# Patient Record
Sex: Male | Born: 1985 | Race: White | Hispanic: No | Marital: Married | Smoking: Current every day smoker
Health system: Southern US, Community
[De-identification: ages and names within clinical notes are randomized; demographics above are authoritative.]

---
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
## 2004-06-07 ENCOUNTER — Emergency Department (HOSPITAL_COMMUNITY): Admission: EM | Admit: 2004-06-07 | Discharge: 2004-06-07 | Payer: Self-pay | Admitting: Family Medicine

## 2005-02-24 ENCOUNTER — Emergency Department (HOSPITAL_COMMUNITY): Admission: EM | Admit: 2005-02-24 | Discharge: 2005-02-24 | Payer: Self-pay | Admitting: Emergency Medicine

## 2005-06-04 ENCOUNTER — Emergency Department (HOSPITAL_COMMUNITY): Admission: EM | Admit: 2005-06-04 | Discharge: 2005-06-04 | Payer: Self-pay | Admitting: Family Medicine

## 2006-11-27 ENCOUNTER — Emergency Department (HOSPITAL_COMMUNITY): Admission: EM | Admit: 2006-11-27 | Discharge: 2006-11-28 | Payer: Self-pay | Admitting: Emergency Medicine

## 2008-05-18 ENCOUNTER — Emergency Department (HOSPITAL_COMMUNITY): Admission: EM | Admit: 2008-05-18 | Discharge: 2008-05-18 | Payer: Self-pay | Admitting: Emergency Medicine

## 2009-01-04 ENCOUNTER — Emergency Department (HOSPITAL_COMMUNITY): Admission: EM | Admit: 2009-01-04 | Discharge: 2009-01-04 | Payer: Self-pay | Admitting: Family Medicine

## 2009-06-27 ENCOUNTER — Telehealth: Payer: Self-pay | Admitting: *Deleted

## 2009-07-28 ENCOUNTER — Emergency Department (HOSPITAL_COMMUNITY): Admission: EM | Admit: 2009-07-28 | Discharge: 2009-07-28 | Payer: Self-pay | Admitting: Emergency Medicine

## 2009-08-07 ENCOUNTER — Emergency Department (HOSPITAL_COMMUNITY): Admission: EM | Admit: 2009-08-07 | Discharge: 2009-08-07 | Payer: Self-pay | Admitting: Emergency Medicine

## 2009-09-11 ENCOUNTER — Emergency Department (HOSPITAL_COMMUNITY): Admission: EM | Admit: 2009-09-11 | Discharge: 2009-09-11 | Payer: Self-pay | Admitting: Emergency Medicine

## 2009-09-11 ENCOUNTER — Ambulatory Visit: Payer: Self-pay | Admitting: Psychiatry

## 2009-09-11 ENCOUNTER — Inpatient Hospital Stay (HOSPITAL_COMMUNITY): Admission: AD | Admit: 2009-09-11 | Discharge: 2009-09-15 | Payer: Self-pay | Admitting: Psychiatry

## 2009-09-28 ENCOUNTER — Emergency Department (HOSPITAL_COMMUNITY): Admission: EM | Admit: 2009-09-28 | Discharge: 2009-09-29 | Payer: Self-pay | Admitting: Emergency Medicine

## 2010-04-11 NOTE — Progress Notes (Signed)
Summary: triage  Phone Note Call from Patient Call back at [phone]   Caller: [facility] Summary of Call: They have a rx that says it was written by Dr. Perley Jain and before the call the police they need to verify if he wrote one for this pt. Initial call taken by: Clydell Hakim,  June 27, 2009 1:42 PM  Follow-up for Phone Call        drug is oxycodone 30mg . it is on a rx of Dr. Perley Jain. she thinks someone has a copy & is ordering themselves drugs. she has already notified the police. I asked her to fax it here. told her he is not a pt here & the doctor is on vacation. notifiied the 2 preceptors and Dennison Nancy. I went to all of the residents in clinic & reminded them to keep their rx pads secure. placed copy of the rx in Dr. Thurnell Lose, [place] box Follow-up by: Golden Circle RN,  June 27, 2009 2:10 PM

## 2010-05-06 IMAGING — CT CT HEAD W/O CM
1 series · 16 of 30 positions shown, 20 images · non-contrast
Comparison: None

CLINICAL DATA: Seizure today, confusion, EtOH and cocaine abuse

CT HEAD WITHOUT CONTRAST
TECHNIQUE: Contiguous axial images were obtained from the base of
the skull through the vertex without contrast.

[Series 2: headseq 4.8 h45s · axial · 0.43mm/px · z∈[-125,+4]mm · 16 of 30 slices shown, 20 images]
[im 2/30  brain]
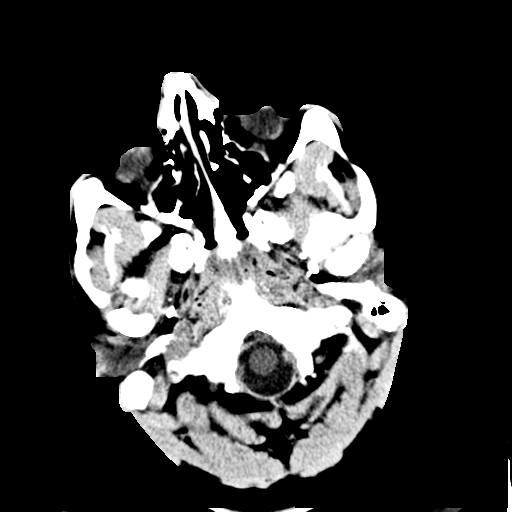
[im 2/30  bone]
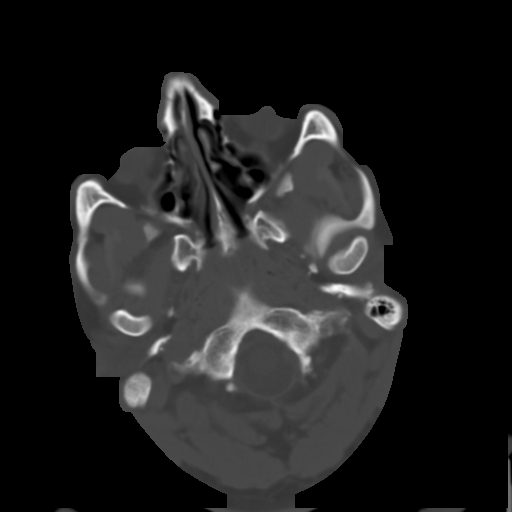
[im 4/30  brain]
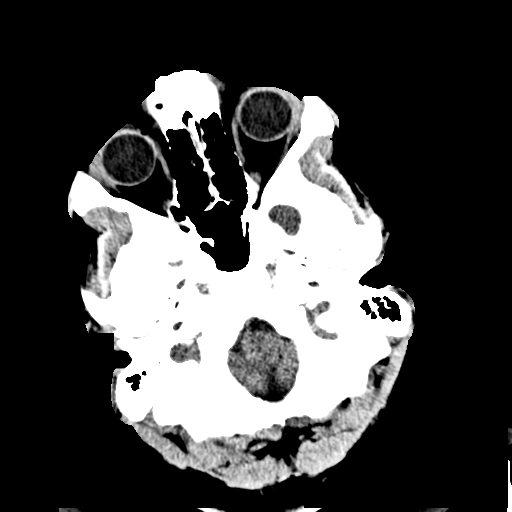
[im 6/30  brain]
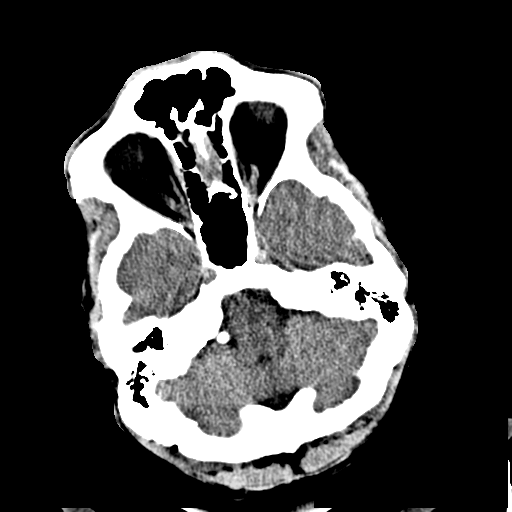
[im 8/30  brain]
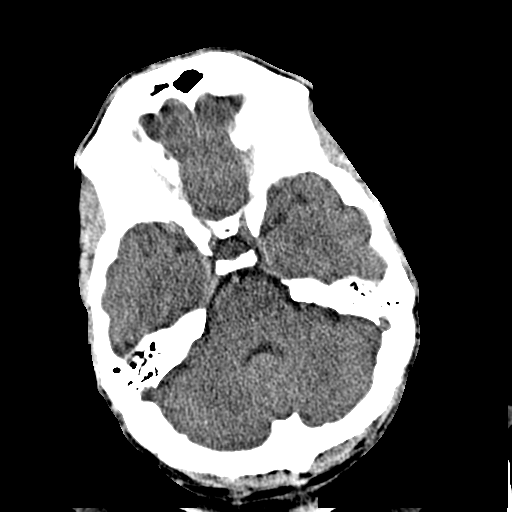
[im 9/30  brain]
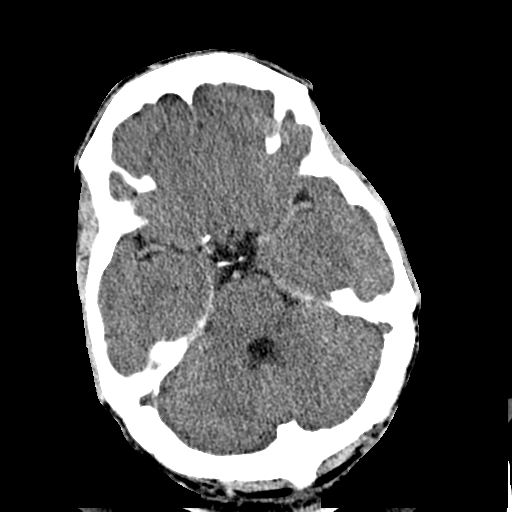
[im 9/30  bone]
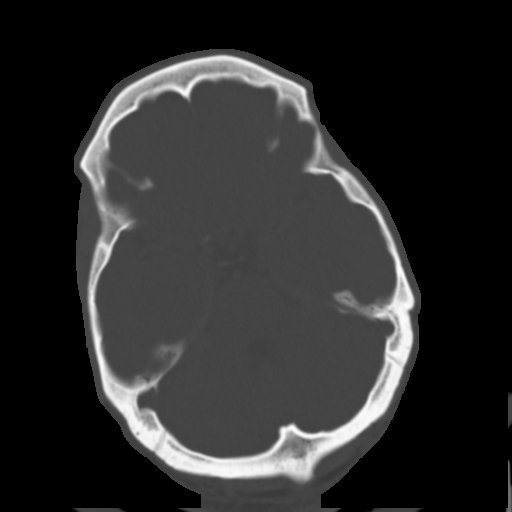
[im 11/30  brain]
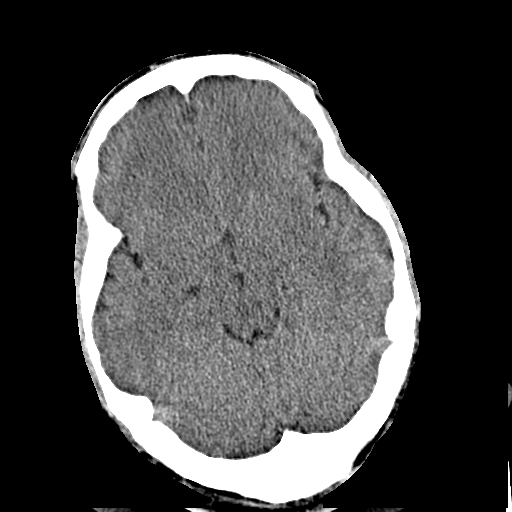
[im 13/30  brain]
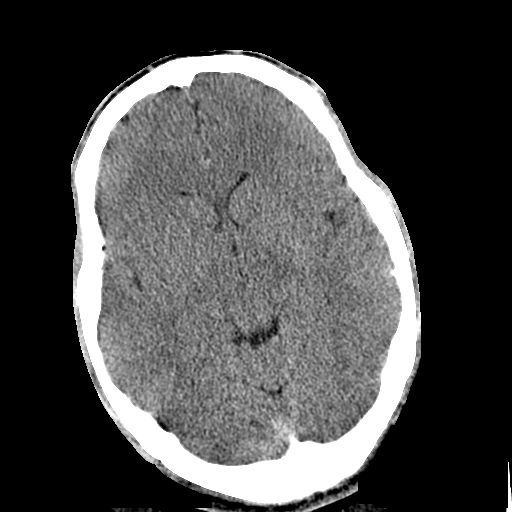
[im 15/30  brain]
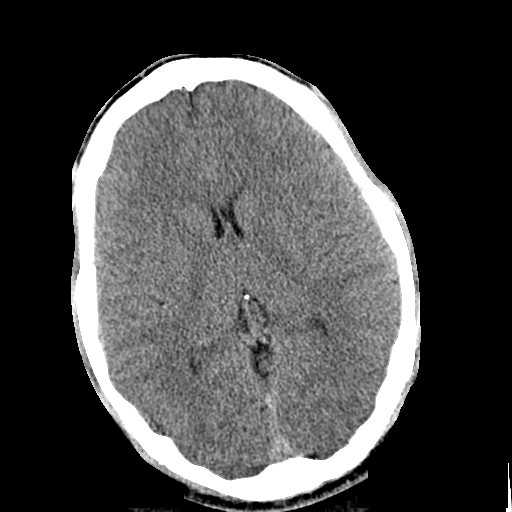
[im 16/30  brain]
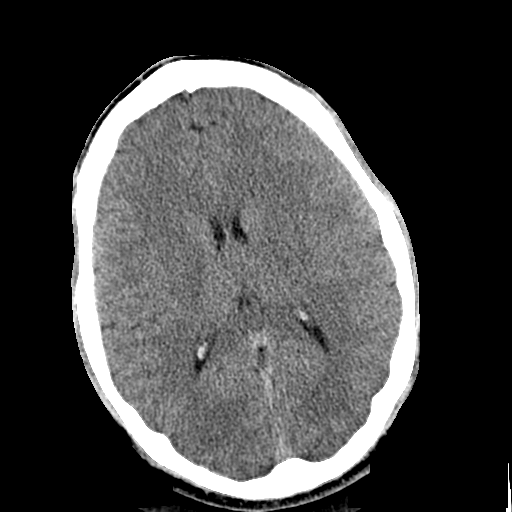
[im 16/30  bone]
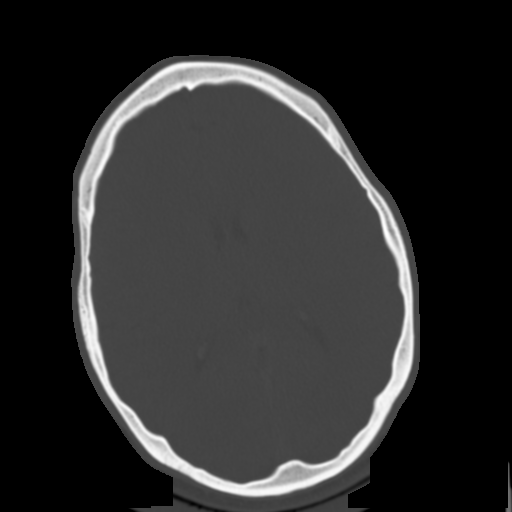
[im 18/30  brain]
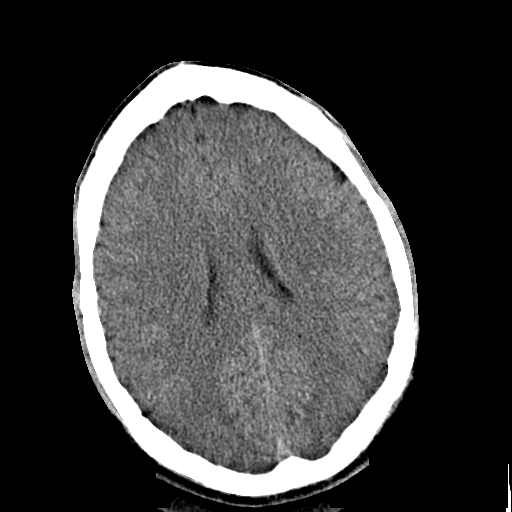
[im 20/30  brain]
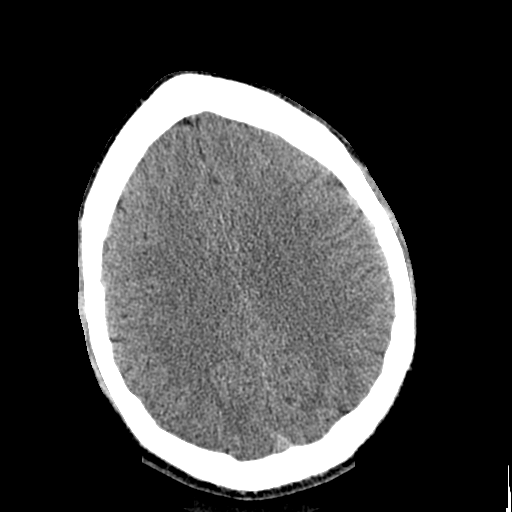
[im 22/30  brain]
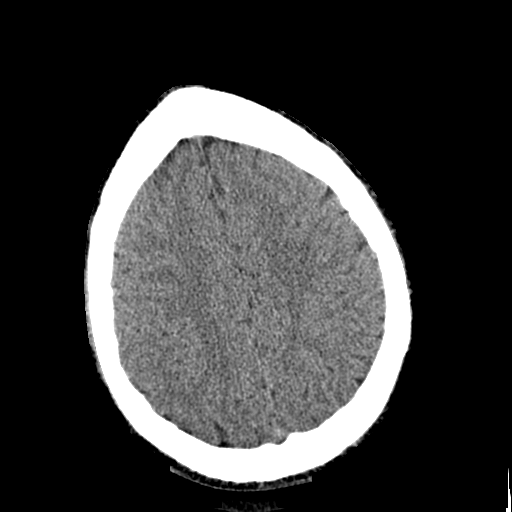
[im 23/30  brain]
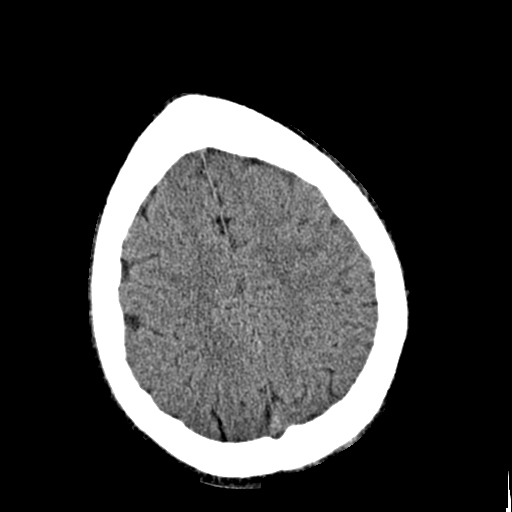
[im 23/30  bone]
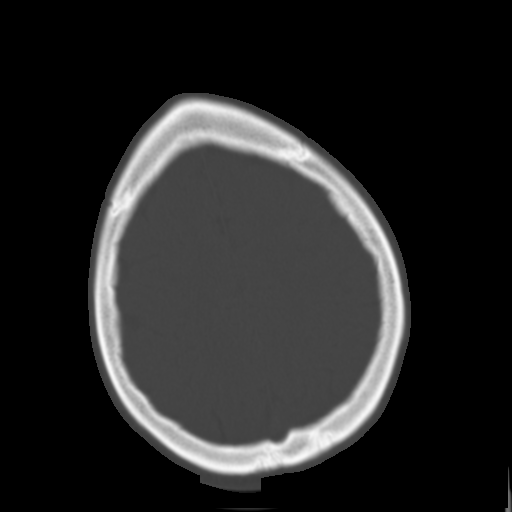
[im 25/30  brain]
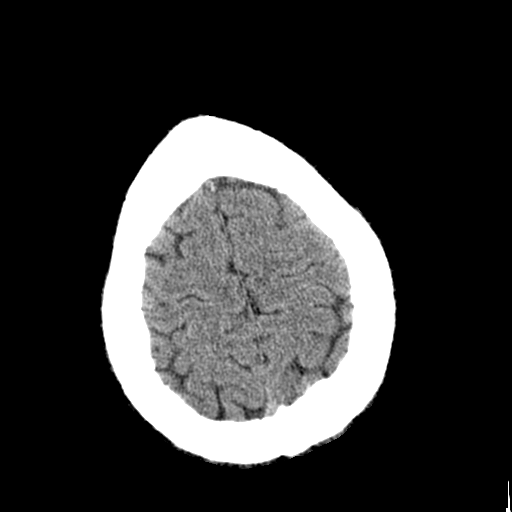
[im 27/30  brain]
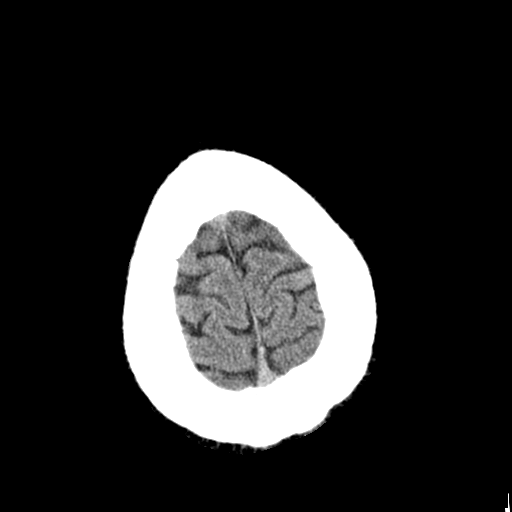
[im 29/30  brain]
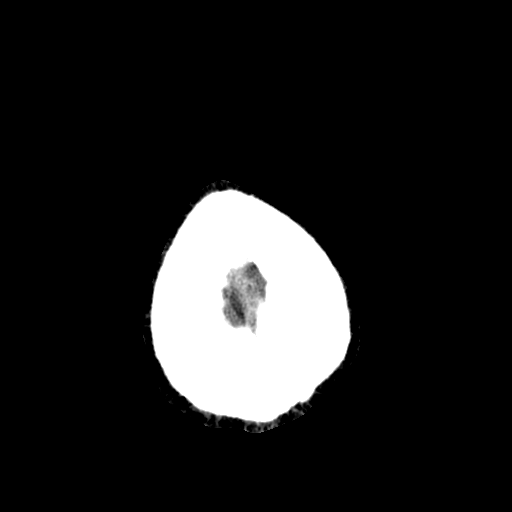

[16 of 30 positions shown; findings below may reference images not displayed]

FINDINGS: The ventricular system is normal in size and
configuration, and the septum is in a normal midline position.  The
fourth ventricle and basilar cisterns appear normal.  No blood,
edema, or mass effect is seen.  On bone window images no bony
abnormality is noted.
IMPRESSION: No acute intracranial abnormality.

## 2010-05-27 LAB — CBC
HCT: 45.2 % (ref 39.0–52.0)
Hemoglobin: 15.3 g/dL (ref 13.0–17.0)
MCH: 32.7 pg (ref 26.0–34.0)
MCHC: 33.9 g/dL (ref 30.0–36.0)
MCV: 96.3 fL (ref 78.0–100.0)
RDW: 13.9 % (ref 11.5–15.5)

## 2010-05-27 LAB — BASIC METABOLIC PANEL
BUN: 15 mg/dL (ref 6–23)
CO2: 26 mEq/L (ref 19–32)
Glucose, Bld: 90 mg/dL (ref 70–99)
Potassium: 3.8 mEq/L (ref 3.5–5.1)
Sodium: 134 mEq/L — ABNORMAL LOW (ref 135–145)

## 2010-05-27 LAB — DIFFERENTIAL
Basophils Relative: 0 % (ref 0–1)
Eosinophils Absolute: 0.1 10*3/uL (ref 0.0–0.7)
Eosinophils Relative: 2 % (ref 0–5)
Monocytes Absolute: 1.1 10*3/uL — ABNORMAL HIGH (ref 0.1–1.0)
Monocytes Relative: 13 % — ABNORMAL HIGH (ref 3–12)
Neutro Abs: 4.5 10*3/uL (ref 1.7–7.7)

## 2010-05-27 LAB — POCT CARDIAC MARKERS
CKMB, poc: 1 ng/mL (ref 1.0–8.0)
CKMB, poc: <1 ng/mL — ABNORMAL LOW (ref 1.0–8.0)
Troponin i, poc: <0.05 ng/mL (ref 0.00–0.09)

## 2010-05-27 LAB — URINALYSIS, ROUTINE W REFLEX MICROSCOPIC
Ketones, ur: NEGATIVE mg/dL
Nitrite: NEGATIVE
pH: 5.5 (ref 5.0–8.0)

## 2010-05-27 LAB — RAPID URINE DRUG SCREEN, HOSP PERFORMED
Cocaine: POSITIVE — AB
Tetrahydrocannabinol: NOT DETECTED

## 2010-05-28 LAB — BASIC METABOLIC PANEL
BUN: 14 mg/dL (ref 6–23)
Chloride: 101 mEq/L (ref 96–112)
Glucose, Bld: 96 mg/dL (ref 70–99)
Potassium: 3.4 mEq/L — ABNORMAL LOW (ref 3.5–5.1)

## 2010-05-28 LAB — CBC
HCT: 46.3 % (ref 39.0–52.0)
MCHC: 35.2 g/dL (ref 30.0–36.0)
MCV: 93.2 fL (ref 78.0–100.0)
RDW: 13.8 % (ref 11.5–15.5)

## 2010-05-28 LAB — RAPID URINE DRUG SCREEN, HOSP PERFORMED
Cocaine: POSITIVE — AB
Opiates: NOT DETECTED
Tetrahydrocannabinol: NOT DETECTED

## 2010-05-28 LAB — DIFFERENTIAL
Basophils Absolute: 0 10*3/uL (ref 0.0–0.1)
Basophils Relative: 1 % (ref 0–1)
Eosinophils Relative: 3 % (ref 0–5)
Monocytes Absolute: 0.9 10*3/uL (ref 0.1–1.0)

## 2010-06-22 LAB — URINALYSIS, ROUTINE W REFLEX MICROSCOPIC
Bilirubin Urine: NEGATIVE
Nitrite: NEGATIVE
Urobilinogen, UA: 0.2 mg/dL (ref 0.0–1.0)
pH: 6 (ref 5.0–8.0)

## 2010-06-22 LAB — DIFFERENTIAL
Basophils Absolute: 0 10*3/uL (ref 0.0–0.1)
Lymphocytes Relative: 25 % (ref 12–46)
Monocytes Absolute: 0.7 10*3/uL (ref 0.1–1.0)
Neutro Abs: 5.7 10*3/uL (ref 1.7–7.7)
Neutrophils Relative %: 66 % (ref 43–77)

## 2010-06-22 LAB — POCT I-STAT, CHEM 8
BUN: 8 mg/dL (ref 6–23)
Calcium, Ion: 1.13 mmol/L (ref 1.12–1.32)
Chloride: 107 mEq/L (ref 96–112)
Potassium: 3.8 mEq/L (ref 3.5–5.1)
Sodium: 140 mEq/L (ref 135–145)

## 2010-06-22 LAB — CBC
Hemoglobin: 14.4 g/dL (ref 13.0–17.0)
RDW: 15.1 % (ref 11.5–15.5)

## 2010-06-22 LAB — ETHANOL: Alcohol, Ethyl (B): 75 mg/dL — ABNORMAL HIGH (ref 0–10)

## 2010-06-22 LAB — RAPID URINE DRUG SCREEN, HOSP PERFORMED
Cocaine: POSITIVE — AB
Tetrahydrocannabinol: NOT DETECTED

## 2011-09-16 IMAGING — CR DG CHEST 2V
2 series · 2 of 2 positions shown · non-contrast
Comparison: Report from 12/05/2001

CLINICAL DATA: Chest pain.  Shortness of breath.  Cough.

CHEST - 2 VIEW

[w chest pa]
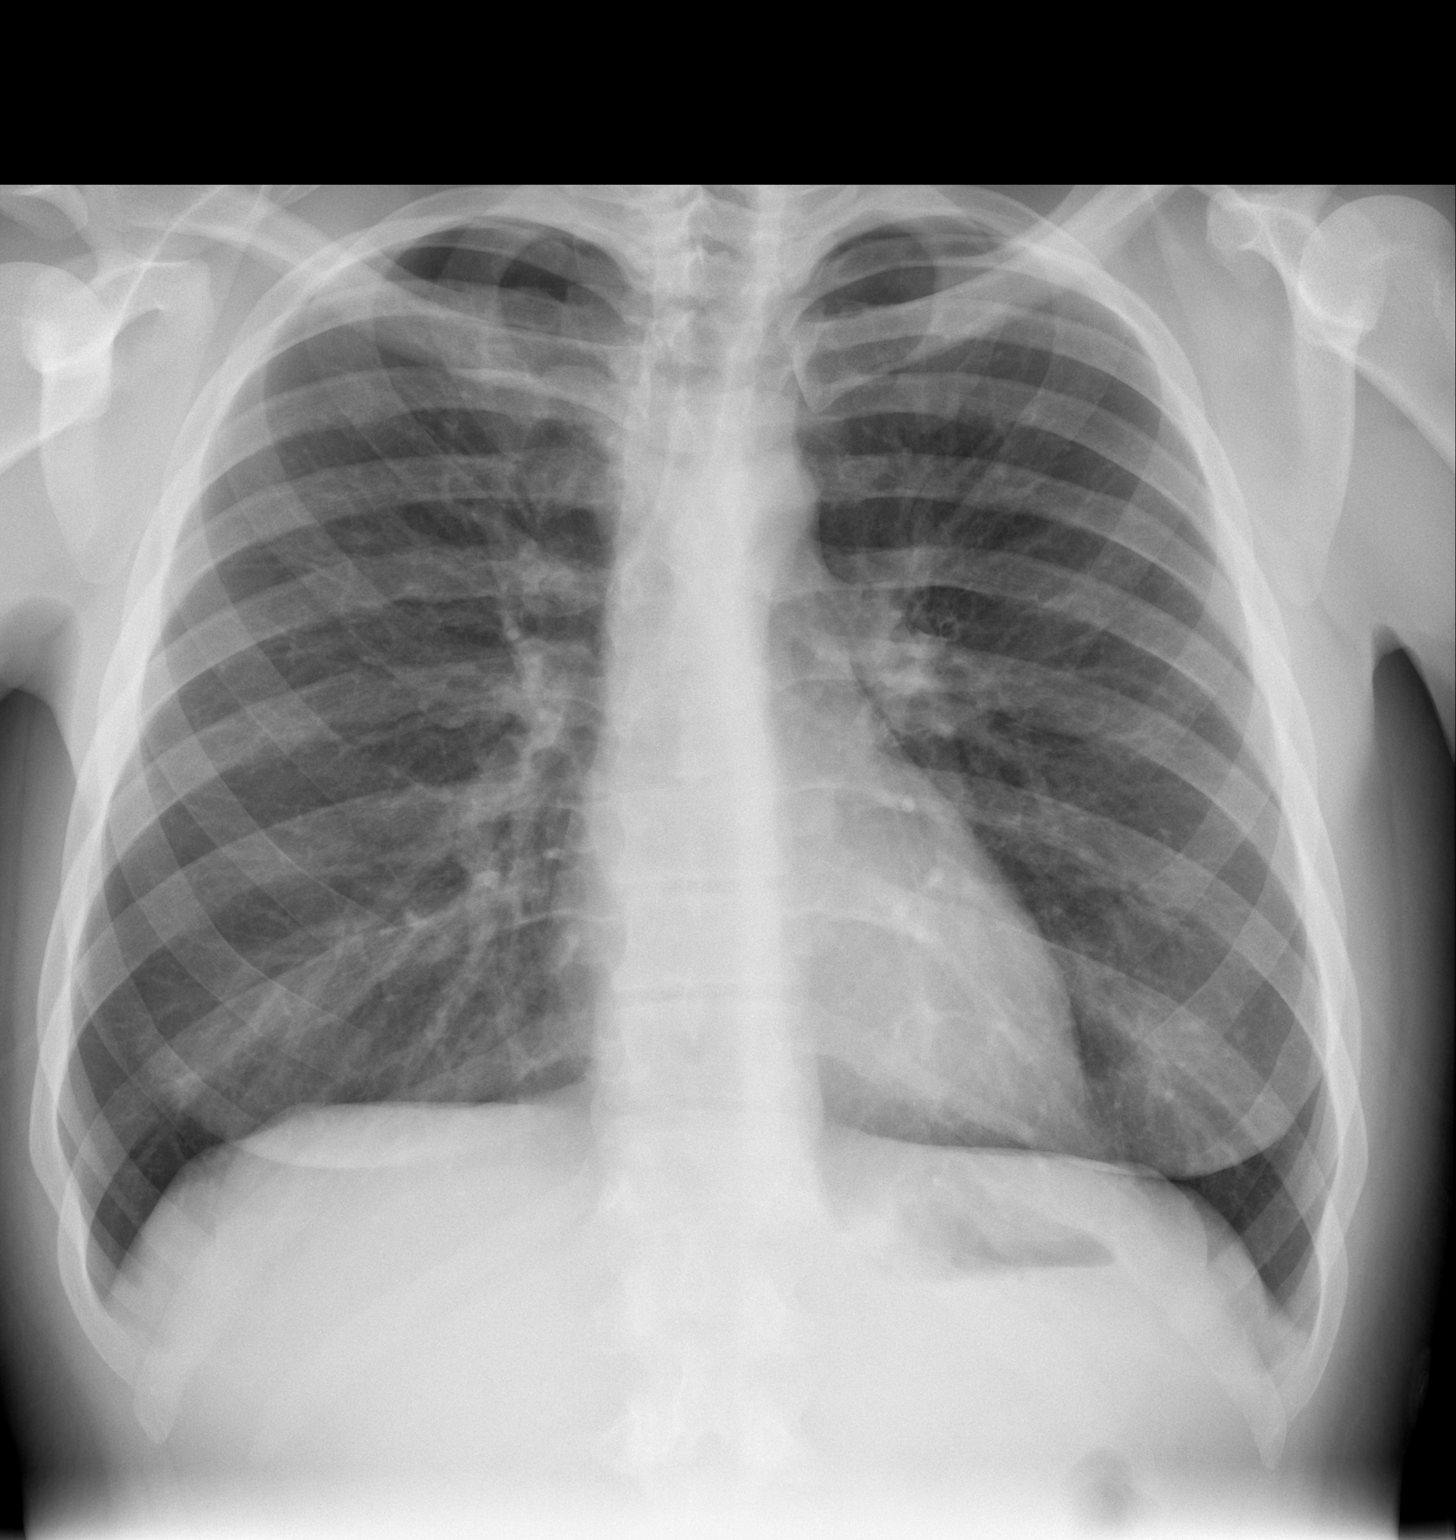

[w chest lat]
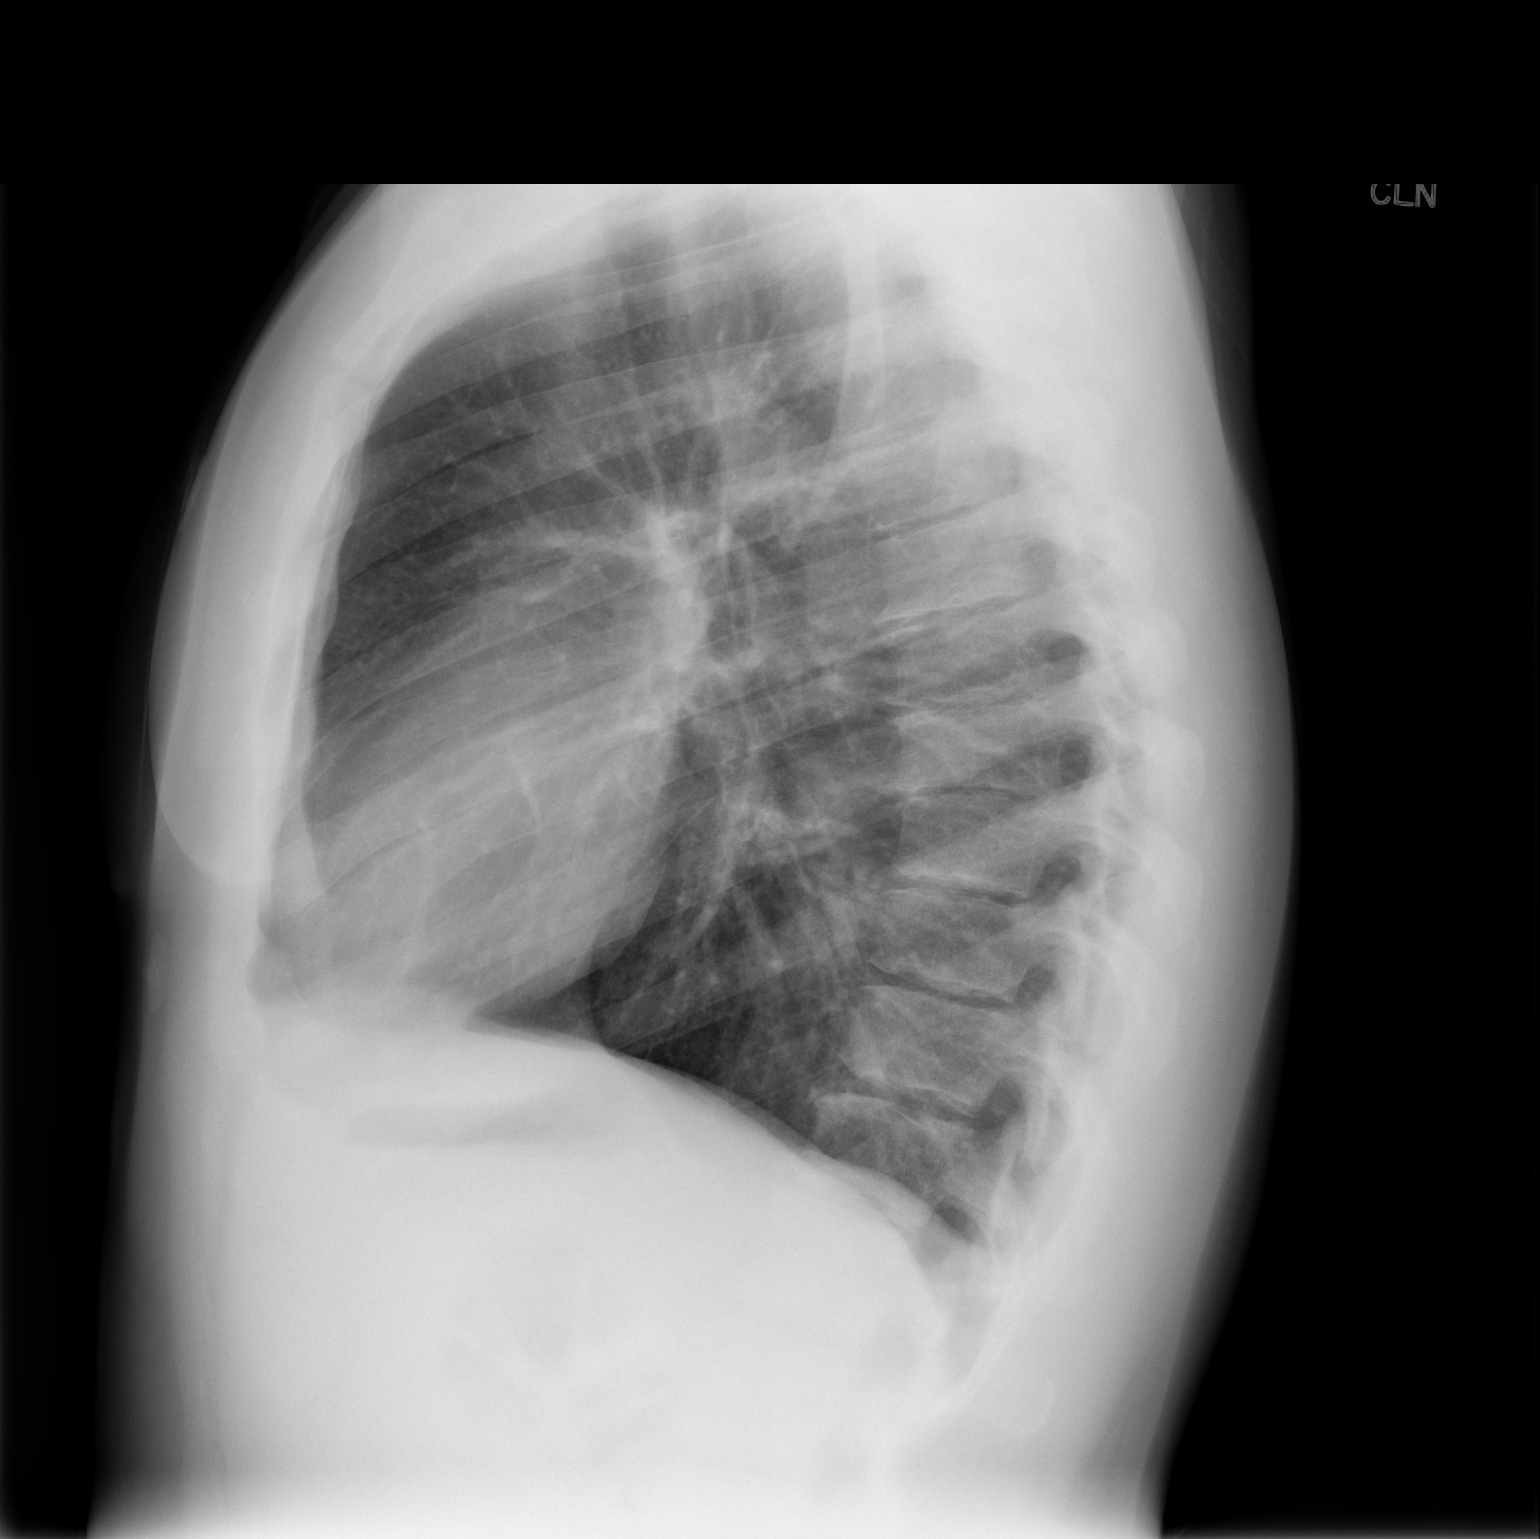

[2 of 2 positions shown; findings below may reference images not displayed]

FINDINGS: Cardiac and mediastinal contours appear normal.

The lungs appear clear.

No pleural effusion is identified.
IMPRESSION: No significant abnormality identified.

## 2013-06-24 ENCOUNTER — Emergency Department (HOSPITAL_COMMUNITY): Payer: Self-pay

## 2013-06-24 ENCOUNTER — Encounter (HOSPITAL_COMMUNITY): Payer: Self-pay | Admitting: Emergency Medicine

## 2013-06-24 ENCOUNTER — Emergency Department (HOSPITAL_COMMUNITY)
Admission: EM | Admit: 2013-06-24 | Discharge: 2013-06-24 | Disposition: A | Discharge status: Home / Self Care | Payer: Self-pay | Attending: Emergency Medicine | Admitting: Emergency Medicine

## 2013-06-24 DIAGNOSIS — X58XXXA Exposure to other specified factors, initial encounter: Secondary | ICD-10-CM | POA: Insufficient documentation

## 2013-06-24 DIAGNOSIS — Y929 Unspecified place or not applicable: Secondary | ICD-10-CM | POA: Insufficient documentation

## 2013-06-24 DIAGNOSIS — F17200 Nicotine dependence, unspecified, uncomplicated: Secondary | ICD-10-CM | POA: Insufficient documentation

## 2013-06-24 DIAGNOSIS — Y9302 Activity, running: Secondary | ICD-10-CM | POA: Insufficient documentation

## 2013-06-24 DIAGNOSIS — M25512 Pain in left shoulder: Secondary | ICD-10-CM

## 2013-06-24 DIAGNOSIS — M25519 Pain in unspecified shoulder: Secondary | ICD-10-CM

## 2013-06-24 DIAGNOSIS — S46909A Unspecified injury of unspecified muscle, fascia and tendon at shoulder and upper arm level, unspecified arm, initial encounter: Secondary | ICD-10-CM | POA: Insufficient documentation

## 2013-06-24 DIAGNOSIS — T148XXA Other injury of unspecified body region, initial encounter: Secondary | ICD-10-CM

## 2013-06-24 DIAGNOSIS — S4980XA Other specified injuries of shoulder and upper arm, unspecified arm, initial encounter: Secondary | ICD-10-CM | POA: Insufficient documentation

## 2013-06-24 DIAGNOSIS — S0081XA Abrasion of other part of head, initial encounter: Secondary | ICD-10-CM

## 2013-06-24 DIAGNOSIS — Z23 Encounter for immunization: Secondary | ICD-10-CM | POA: Insufficient documentation

## 2013-06-24 DIAGNOSIS — IMO0002 Reserved for concepts with insufficient information to code with codable children: Secondary | ICD-10-CM | POA: Insufficient documentation

## 2013-06-24 DIAGNOSIS — S40019A Contusion of unspecified shoulder, initial encounter: Secondary | ICD-10-CM | POA: Insufficient documentation

## 2013-06-24 MED ORDER — TETANUS-DIPHTH-ACELL PERTUSSIS 5-2.5-18.5 LF-MCG/0.5 IM SUSP
0.5000 mL | Freq: Once | INTRAMUSCULAR | Status: AC
  Administered 2013-06-24: 0.5 mL via INTRAMUSCULAR
  Filled 2013-06-24: qty 0.5

## 2013-06-24 NOTE — ED Provider Notes (Signed)
CSN: [id number]     Arrival date & time 06/24/13  1345 History   First MD Initiated Contact with Patient 06/24/13 1347     Chief Complaint  Patient presents with  . Shoulder Pain     (Consider location/radiation/quality/duration/timing/severity/associated sxs/prior Treatment) HPI  Patient presents with left shoulder pain after running from the police and being tackled.  States he initially injured it yesterday while also running from the police.  Pain is constant, worse with palpation, severe.  Denies weakness or numbness of the arms.  Denies chest pain or shortness of breath.  Denies other injury or pain elsewhere.    History reviewed. No pertinent past medical history. History reviewed. No pertinent past surgical history. No family history on file. History  Substance Use Topics  . Smoking status: Current Every Day Smoker  . Smokeless tobacco: Not on file  . Alcohol Use: Yes    Review of Systems  Respiratory: Negative for shortness of breath.   Cardiovascular: Negative for chest pain.  Gastrointestinal: Negative for abdominal pain.  Skin: Positive for wound.  Neurological: Negative for syncope, weakness and numbness.  All other systems reviewed and are negative.     Allergies  Review of patient's allergies indicates no known allergies.  Home Medications   Prior to Admission medications   Not on File   BP 127/77  Pulse 84  Temp(Src) 97.6 F (36.4 C) (Oral)  Resp 22  Ht 5\' 8"  (1.727 m)  Wt 200 lb (90.719 kg)  BMI 30.42 kg/m2  SpO2 95% Physical Exam  Nursing note and vitals reviewed. Constitutional: He appears well-developed and well-nourished. No distress.  HENT:  Head: Normocephalic.    Neck: Neck supple.  Cardiovascular: Normal rate and regular rhythm.   Pulmonary/Chest: Effort normal and breath sounds normal. No respiratory distress. He has no wheezes. He has no rales.  Abdominal: Soft. He exhibits no distension. There is no tenderness. There is no  rebound and no guarding.  Musculoskeletal:       Left shoulder: He exhibits pain. He exhibits no deformity, no laceration, normal pulse and normal strength.       Arms: Upper extremities:  Strength 5/5, sensation intact, distal pulses intact.    Neurological: He is alert. He exhibits normal muscle tone.  Skin: He is not diaphoretic.    ED Course  Procedures (including critical care time) Labs Review Labs Reviewed - No data to display  Imaging Review Dg Shoulder Left  06/24/2013   CLINICAL DATA:  Pain post trauma  EXAM: LEFT SHOULDER - 2+ VIEW  COMPARISON:  None.  FINDINGS: Internal rotation, external rotation, and Y scapular images were obtained. There is no fracture or dislocation. Joint spaces appear intact. No erosive change.  IMPRESSION: No abnormality noted.   Electronically Signed   By: Bretta BangWilliam  Woodruff M.D.   On: 06/24/2013 15:16     EKG Interpretation None      MDM   Final diagnoses:  Left shoulder pain  Contusion  AC joint pain  Facial abrasion    Pt with left shoulder pain after running from and being tackled by police.  Left shoulder with ecchymosis.  Tender throughout superior and lateral shoulder, including over AC joint.  Neurovascularly intact.   Xray negative.  Does have very small abrasion over right eyebrow.  No laceration.  Denies other injury.  D/C with police, with sling for comfort.  PCP follow up.  ICE, NSAIDs/tylenol for pain.  Discussed result, findings, treatment, and follow up  with patient.  Pt given return precautions.  Pt verbalizes understanding and agrees with plan.        Trixie Dredgemily Demarkus Remmel, PA-C 06/24/13 1534

## 2013-06-24 NOTE — ED Notes (Signed)
Patient transported to X-ray 

## 2013-06-24 NOTE — ED Notes (Signed)
Pt discharged in handcuffs with police. Sling sent with patient to be applied at jail.

## 2013-06-24 NOTE — ED Notes (Signed)
Per EMS- Pt in [employer] custody, handcuffed to bed. Reporting left shoulder pain, small laceration to right eye. Pt is a x 4. Is crying.

## 2013-06-24 NOTE — ED Notes (Signed)
Pt returned from xray. Hooked back up to monitor.  

## 2013-06-24 NOTE — Discharge Instructions (Signed)
Read the information below.  You may return to the Emergency Department at any time for worsening condition or any new symptoms that concern you.  You may take tylenol or ibuprofen as needed according to the direction on the packaging for pain.  You may apply ice to your shoulder several times a day for pain.  Please follow up with your primary care provider.  If you develop uncontrolled pain, weakness or numbness of the extremity, severe discoloration of the skin, or you are unable to move your arm, return to the ER for a recheck.   Keep the wound on your face clean, cover with bacitracin/neosporin and a bandage while it heals.  If you develop redness, swelling, pus draining from the wound, or fevers greater than 100.4, return to the ER immediately for a recheck.     Abrasions An abrasion is a cut or scrape of the skin. Abrasions do not go through all layers of the skin. HOME CARE  If a bandage (dressing) was put on your wound, change it as told by your doctor. If the bandage sticks, soak it off with warm.  Wash the area with water and soap 2 times a day. Rinse off the soap. Pat the area dry with a clean towel.  Put on medicated cream (ointment) as told by your doctor.  Change your bandage right away if it gets wet or dirty.  Only take medicine as told by your doctor.  See your doctor within 24 48 hours to get your wound checked.  Check your wound for redness, puffiness (swelling), or yellowish-white fluid (pus). GET HELP RIGHT AWAY IF:   You have more pain in the wound.  You have redness, swelling, or tenderness around the wound.  You have pus coming from the wound.  You have a fever or lasting symptoms for more than 2 3 days.  You have a fever and your symptoms suddenly get worse.  You have a bad smell coming from the wound or bandage. MAKE SURE YOU:   Understand these instructions.  Will watch your condition.  Will get help right away if you are not doing well or get  worse. Document Released: 08/15/2007 Document Revised: 11/21/2011 Document Reviewed: 01/30/2011 [facility] Patient Information 2014 [place], [place].  Contusion A contusion is a deep bruise. Contusions happen when an injury causes bleeding under the skin. Signs of bruising include pain, puffiness (swelling), and discolored skin. The contusion may turn blue, purple, or yellow. HOME CARE   Put ice on the injured area.  Put ice in a plastic bag.  Place a towel between your skin and the bag.  Leave the ice on for 15-20 minutes, 03-04 times a day.  Only take medicine as told by your doctor.  Rest the injured area.  If possible, raise (elevate) the injured area to lessen puffiness. GET HELP RIGHT AWAY IF:   You have more bruising or puffiness.  You have pain that is getting worse.  Your puffiness or pain is not helped by medicine. MAKE SURE YOU:   Understand these instructions.  Will watch your condition.  Will get help right away if you are not doing well or get worse. Document Released: 08/15/2007 Document Revised: 05/21/2011 Document Reviewed: 01/01/2011 [facility] Patient Information 2014 [place], [place].  Shoulder Pain The shoulder is the joint that connects your arm to your body. Muscles and band-like tissues that connect bones to muscles (tendons) hold the joint together. Shoulder pain is felt if an injury or medical problem  affects one or more parts of the shoulder. HOME CARE   Put ice on the sore area.  Put ice in a plastic bag.  Place a towel between your skin and the bag.  Leave the ice on for 15-20 minutes, 03-04 times a day for the first 2 days.  Stop using cold packs if they do not help with the pain.  If you were given something to keep your shoulder from moving (sling, shoulder immobilizer), wear it as told. Only take it off to shower or bathe.  Move your arm as little as possible, but keep your hand moving to prevent puffiness (swelling).  Squeeze a soft  ball or foam pad as much as possible to help prevent swelling.  Take medicine as told by your doctor. GET HELP RIGHT AWAY IF:   Your arm, hand, or fingers are numb or tingling.  Your arm, hand, or fingers are puffy (swollen), painful, or turn white or blue.  You have more pain.  You have progressing new pain in your arm, hand, or fingers.  Your hand or fingers get cold.  Your medicine does not help lessen your pain. MAKE SURE YOU:   Understand these instructions.  Will watch your condition.  Will get help right away if you are not doing well or get worse. Document Released: 08/15/2007 Document Revised: 11/21/2011 Document Reviewed: 09/10/2011 [facility] Patient Information 2014 [place], [place].   Emergency Department Resource Guide 1) Find a Doctor and Pay Out of Pocket Although you won't have to find out who is covered by your insurance plan, it is a good idea to ask around and get recommendations. You will then need to call the office and see if the doctor you have chosen will accept you as a new patient and what types of options they offer for patients who are self-pay. Some doctors offer discounts or will set up payment plans for their patients who do not have insurance, but you will need to ask so you aren't surprised when you get to your appointment.  2) Contact Your Local Health Department Not all health departments have doctors that can see patients for sick visits, but many do, so it is worth a call to see if yours does. If you don't know where your local health department is, you can check in your phone book. The CDC also has a tool to help you locate your state's health department, and many state websites also have listings of all of their local health departments.  3) Find a Walk-in Clinic If your illness is not likely to be very severe or complicated, you may want to try a walk in clinic. These are popping up all over the country in pharmacies, drugstores, and shopping  centers. They're usually staffed by nurse practitioners or physician assistants that have been trained to treat common illnesses and complaints. They're usually fairly quick and inexpensive. However, if you have serious medical issues or chronic medical problems, these are probably not your best option.  No Primary Care Doctor: - Call Health Connect at  [phone] - they can help you locate a primary care doctor that  accepts your insurance, provides certain services, etc. - Physician Referral Service- [phone]  Chronic Pain Problems: Organization         Address  Phone   Notes  [facility]  [phone] Patients need to be referred by their primary care doctor.   Medication Assistance: Organization  Address  Phone   Notes  [facility] Medication [facility] [address] [place], [place] [address] [phone](336) [phone] --Must be a resident of [facility] [place] -- Must have NO insurance coverage whatsoever (no Medicaid/ Medicare, etc.) -- The pt. MUST have a primary care doctor that directs their care regularly and follows them in the community   MedAssist  [phone]   [employer]  [phone]    Agencies that provide inexpensive medical care: Organization         Address  Phone   Notes  [facility]  [phone](336) [phone]   [facility]    [phone]) [phone]   [facility] [address] [place], [place] [address] [phone]) [phone]   [facility] [address], [place] [phone](336) [phone]   Planned Parenthood    [phone]) [phone]   [facility]    [phone]) [phone]   [facility]  [address], [place] Phone:  [phone], Fax:  [phone] Hours of Operation:  9 am - 6 pm, M-F.  Also accepts Medicaid/Medicare and self-pay.  [facility]  [address], [place] Phone: [phone]) [phone], Fax: [phone](336)  [phone]. Hours of Operation:  8:30 am - 5:30 pm, M-F.  Also accepts Medicaid and self-pay.  [facility] [place] [address], [place] Phone: [phone]) [phone]   [facility] [address], [place], [place] [phone], Ext. [phone] Mondays & Thursdays: 7-9 AM.  First 15 patients are seen on a first come, first serve basis.    Medicaid-accepting [facility] Providers:  Organization         Address  Phone   Notes  [facility] [address], [place] [phone]) [phone] Also accepts self-pay patients.  [facility] [address], [place]  [phone]) [phone]   [facility] [address], [place] [phone](336) [phone]   [facility] [address] [place] [address], [place] [phone](336) [phone]   Renaye RakersVeita Bland [address], [place]   [phone]) [phone] Only accepts [place] Access [place] patients after they have their name applied to their card.   Self-Pay (no insurance) in [facility]:  Organization         Address  Phone   Notes  Sickle Cell Patients, [facility] [address], [place] [phone]) [phone]   [facility] [address], [place] [phone]) [phone]   [facility] [place]  [address], [place] [phone]) [phone]   [facility]/Dr. Osei-Bonsu  [address] [place] [address], [place] or [phone], [place] [phone]) [phone] Phone number for both [place] and [place] locations is the same.  [facility] [address], [place] [phone]) [phone]   [facility]  [address] [place] [address], [place] or [address] [phone]) [phone]   [facility] [address], [place] [phone](336) [phone], phone; [phone](336) [phone], fax Sees patients 1st and 3rd Saturday of every month.  Must not qualify for public or private insurance (i.e.  Medicaid, Medicare, [place] Health Choice, Veterans' Benefits)  Household income should be no more than 200% of the poverty level The clinic cannot treat you if you are pregnant or think you  are pregnant  Sexually transmitted diseases are not treated at the clinic.    Dental Care: Organization         Address  Phone  Notes  [facility] of [facility] [address] [place], [place] [phone] Accepts children up to age 73 who are enrolled in [place] or [place] Health Choice; pregnant women with a Medicaid card; and children who have applied for Medicaid or [place] Health Choice, but were declined, whose parents can pay a reduced fee at time of service.  [facility] Department of [facility]  [address], [place] [phone] Accepts children up to age 20 who are enrolled in [place] or [place] Health Choice; pregnant women with a Medicaid card; and children who have applied for Medicaid or  Health Choice, but were declined, whose parents can pay a reduced fee at time of service.  [facility]  [address] [place], [place] [phone] Patients are seen by appointment only. Walk-ins are not accepted. [facility] will see patients 62 years of age and older. Monday - Tuesday (8am-5pm) Most Wednesdays (8:30-5pm) $30 per visit, cash only  [facility] Access PROGRAM  [address], [facility] [phone] Patients are seen by appointment only. Walk-ins are not accepted. [facility] will see patients 37 years of age and older. One Wednesday Evening (Monthly: Volunteer Based).  $30 per visit, cash only  [employer]  [phone] for adults; Children under age 32, call Graduate Pediatric Dentistry at [phone]. Children aged 77-14, please call [phone] to request a pediatric application.  Dental services are provided in all areas of dental care including fillings,  crowns and bridges, complete and partial dentures, implants, gum treatment, root canals, and extractions. Preventive care is also provided. Treatment is provided to both adults and children. Patients are selected via a lottery and there is often a waiting list.   [facility] [address], [place]  [phone] [email or web address]   [facility] [address] [place], [place] [phone], Ext. [phone] Second and Fourth Thursday of each month, opens at 6:30 AM; Clinic ends at 9 AM.  Patients are seen on a first-come first-served basis, and a limited number are seen during each clinic.   [facility]  [address] Ether Griffins [place], [place] [phone]   Eligibility Requirements You must have lived in [place], [place], or [place] counties for at least the last three months.   You cannot be eligible for state or federal sponsored [place], including [employer], [place], or [employer].   You generally cannot be eligible for healthcare insurance through your employer.    How to apply: Eligibility screenings are held every Tuesday and Wednesday afternoon from 1:00 pm until 4:00 pm. You do not need an appointment for the interview!  [facility] [address], [place], [place] [phone]   [facility] Health Department  [phone]   [facility] Health Department  [phone]   [facility]  [phone]    Behavioral Health Resources in the Community: Intensive Outpatient Programs Organization         Address  Phone  Notes  [facility] [address], [place], [place] [phone]   [facility] Outpatient [address], [place], [place] [phone]   [facility]: [facility] Drug S[facility] [address],  [place], [place]  [phone]   [facility] [address],  [place], [place] [phone] or [phone]   Substance Abuse  Resources Organization         Address  Phone  Notes  [facility] Drug Services  [phone]   [facility]  [phone]   [facility]  [phone]   [facility]  [phone]   Residential & Outpatient Substance Abuse Program  [phone]   Psychological Services Organization         Address  Phone  Notes  [facility]  [phone]- [phone]   [facility]  [phone]- [phone]   [facility] [address], [place] [phone] or [phone]    Mobile Crisis Teams Organization         Address  Phone  Notes  [facility], Mobile Crisis Care Unit  [phone]   [facility]  [address] [place], [place] [phone]   Doristine LocksSharon DeEsch [address] [place] [phone]    Self-Help/Support Groups Organization         Address  Phone             Notes  [facility] Assoc[facility] of [place] - variety of support groups  336- [phone] Call for more information  Narcotics Anonymous (NA), [facility]aring [facility] [address], [place]  2 meetings at this location   Statisticianesidential Treatment Programs Organization         Address  Phone  Notes  [facility] [address],    [place]  [phone]   [facility]  [address], [place] [id number], [place] [place], [place] [phone]   [facility] [address], [place] [phone] Admissions: 8am-3pm M-F  [facility] [address]    [place], [place] [phone]   [facility] [address], [place], [place] [phone]   [facility] [address]  [place], [place] [phone]   [facility] - Intensive Outpatient [address], [place], [place] [phone]   [facility] ([facility] [address]  [place], [place] [phone] or [phone]   [facility] (RTS) [address] [place], [place] [phone] Accepts Medicaid  [facility] [address]  [place] [phone] Substance Abuse/Addiction Treatment   [facility] Behavioral Health Resources Organization         Address  Phone  Notes  [facility]  [phone]) [phone]   Angie FavaJulie Brannon, PhD [address], [address] [place], [place]   [phone]) [phone] or [phone](336) [phone]   [facility]   [address] [place], [place] [phone](336) [phone]   [facility] [address], [place], [place] [phone](336) [phone] Insurance/Medicaid/sponsorship through [facility]  [facility] Families [address]                                    [place], [place] [phone](336) [phone] Therapy/tele-psych/case  [facility] [address]   [place], [place] [phone](336) [phone]    Dr. Lolly MustacheArfeen  [phone](336) [phone]   [facility] [place]  [employer] [facility] 1) [address], [place] 2) [address], [place] 3)  [address], [place] [phone](336) [phone]  [phone](336) [phone]   [facility] Child Abuse Hotline [phone](336)  [phone] or [phone] (After Hours)

## 2013-06-25 NOTE — ED Provider Notes (Signed)
Medical screening examination/treatment/procedure(s) were performed by non-physician practitioner and as supervising physician I was immediately available for consultation/collaboration.     Ellisyn Icenhower, MD 06/25/13 0704
